# Patient Record
Sex: Male | Born: 1990 | Race: Black or African American | Hispanic: No | Marital: Single | Smoking: Never smoker
Health system: Southern US, Community
[De-identification: ages and names within clinical notes are randomized; demographics above are authoritative.]

## PROBLEM LIST (undated history)

## (undated) HISTORY — PX: ANKLE SURGERY: SHX546

---
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
## 2008-07-01 ENCOUNTER — Emergency Department (HOSPITAL_COMMUNITY): Admission: EM | Admit: 2008-07-01 | Discharge: 2008-07-01 | Payer: Self-pay | Admitting: Family Medicine

## 2009-11-21 ENCOUNTER — Emergency Department (HOSPITAL_COMMUNITY): Admission: EM | Admit: 2009-11-21 | Discharge: 2009-11-21 | Payer: Self-pay | Admitting: Emergency Medicine

## 2010-05-26 LAB — POCT I-STAT, CHEM 8
BUN: 11 mg/dL (ref 6–23)
Calcium, Ion: 1.16 mmol/L (ref 1.12–1.32)
Chloride: 104 mEq/L (ref 96–112)
Creatinine, Ser: 1.1 mg/dL (ref 0.4–1.5)
Glucose, Bld: 105 mg/dL — ABNORMAL HIGH (ref 70–99)
HCT: 48 % (ref 39.0–52.0)
Hemoglobin: 16.3 g/dL (ref 13.0–17.0)
Potassium: 3.8 mEq/L (ref 3.5–5.1)
Sodium: 141 mEq/L (ref 135–145)
TCO2: 26 mmol/L (ref 0–100)

## 2010-05-26 LAB — URINALYSIS, ROUTINE W REFLEX MICROSCOPIC
Glucose, UA: NEGATIVE mg/dL
Ketones, ur: 15 mg/dL — AB
Nitrite: NEGATIVE
Protein, ur: 100 mg/dL — AB
Specific Gravity, Urine: 1.028 (ref 1.005–1.030)
Urobilinogen, UA: 1 mg/dL (ref 0.0–1.0)
pH: 5.5 (ref 5.0–8.0)

## 2010-05-26 LAB — URINE CULTURE
Colony Count: NO GROWTH
Culture  Setup Time: 201109111102
Culture: NO GROWTH

## 2010-05-26 LAB — URINE MICROSCOPIC-ADD ON

## 2010-06-22 LAB — POCT URINALYSIS DIP (DEVICE)
Glucose, UA: 100 mg/dL — AB
Ketones, ur: >=160 mg/dL — AB
Nitrite: POSITIVE — AB
Protein, ur: NEGATIVE mg/dL
Specific Gravity, Urine: 1.025 (ref 1.005–1.030)
Urobilinogen, UA: 2 mg/dL — ABNORMAL HIGH (ref 0.0–1.0)
pH: 6 (ref 5.0–8.0)

## 2010-06-22 LAB — POCT I-STAT, CHEM 8
BUN: 7 mg/dL (ref 6–23)
Chloride: 96 mEq/L (ref 96–112)
HCT: 51 % (ref 39.0–52.0)
Potassium: 3.4 mEq/L — ABNORMAL LOW (ref 3.5–5.1)

## 2010-06-22 LAB — URINE CULTURE: Culture: NO GROWTH

## 2010-07-29 NOTE — H&P (Signed)
[place]  Patient:    Steve Valdez, Steve Valdez Visit Number: [id number] MRN: [id number]          Service Type: DSU Location: [facility] Attending Physician:  Twana First Dictated by:   Elana Alm Thurston Hole, M.D. Admit Date:  01/29/2001                           History and Physical  PREOPERATIVE DIAGNOSIS:  Left ankle Salter 3 fracture.  POSTOPERATIVE DIAGNOSIS:  Left ankle Salter 3 fracture.  PROCEDURE: 1. Left ankle arthroscopy with debridement. 2. ORIF left ankle Salter 3 fracture.  SURGEON:  Elana Alm. Thurston Hole, M.D.  ASSISTANT:  Julien Girt, P.A.  ANESTHESIA:  General.  OPERATIVE TIME:  One hour.  COMPLICATIONS:  None.  INDICATIONS FOR PROCEDURE:  Steve Valdez is a 20 year old who sustained a displaced Salter 3 fracture of his left ankle approximately 10 days ago.  He is now to undergo ORIF and arthroscopy of this.  DESCRIPTION:  Steve Valdez was brought to the operating room on January 29, 2001, placed on the operative table in supine position.  After an adequate level of general anesthesia was obtained, his left ankle was sterilely injected with 0.25% Marcaine with epinephrine in the anteromedial and anterolateral portal sites.  The left foot and leg were prepped using sterile Betadine and draped using sterile technique.  He received Ancef 1 g IV preoperatively for prophylaxis.  Initially the arthroscopy was performed through an anterolateral and anteromedial portal, carefully incising only the skin and carefully dissecting down to the joint to prevent any injury to the dorsal cutaneous nerves.  The arthroscope with a pump attached was placed and through an anteromedial portal an arthroscopic debrider was placed.  Significant hemorrhage in the joint was thoroughly debrided.  The fracture was well visualized going from anterior to posterior.  After this debridement was carried out no further damage was noted in the articular cartilage - only  the fracture line through it.  At this point then, under fluoroscopic control, two K-wires from the cannulated 4.0 mm screw set were placed from the medial malleolus across parallel to the joint, staying in the epiphysis across the fracture site.  These were then measured and overdrilled with a 2.7 mm drill and then the appropriate length 4.0 mm screws were placed, thus reducing the fracture and holding it into a reduced and anatomic position.  After this was done, this arthroscope was placed back in the joint.  The fracture was found to be reduced satisfactorily.  AP and lateral fluoroscopic x-rays also confirmed satisfactory position of the fracture and of the hardware, and the hardware was in the epiphysis and did not cross into the physis or go into the joint.  At this point the wounds were closed with interrupted 4-0 nylon sutures.  Sterile dressings and a short leg splint were applied, and then the patient awakened and taken to the recovery room in stable condition.  FOLLOW-UP CARE:  Steve Valdez will be followed as an outpatient on Vicodin for pain. See him back in the office in a week for sutures out and follow-up.  Also of note, I have discussed in great detail with his parents the risk for growth arrest and growth abnormalities due to the severity of his fracture, despite the fact that the fracture is reduced in a satisfactory and anatomic position, that there is still significant chance for growth abnormalities due to the fracture going through the growth  plate, and they understand this.Dictated by:   Elana Alm Thurston Hole, M.D. Attending Physician:  Twana First DD:  01/29/01 TD:  01/29/01 Job: [id number]

## 2011-03-07 ENCOUNTER — Emergency Department (HOSPITAL_COMMUNITY)
Admission: EM | Admit: 2011-03-07 | Discharge: 2011-03-08 | Disposition: A | Discharge status: Home / Self Care | Payer: Managed Care, Other (non HMO) | Attending: Emergency Medicine | Admitting: Emergency Medicine

## 2011-03-07 ENCOUNTER — Encounter: Payer: Self-pay | Admitting: Emergency Medicine

## 2011-03-07 DIAGNOSIS — R10819 Abdominal tenderness, unspecified site: Secondary | ICD-10-CM | POA: Insufficient documentation

## 2011-03-07 DIAGNOSIS — R748 Abnormal levels of other serum enzymes: Secondary | ICD-10-CM | POA: Insufficient documentation

## 2011-03-07 DIAGNOSIS — R112 Nausea with vomiting, unspecified: Secondary | ICD-10-CM

## 2011-03-07 DIAGNOSIS — E669 Obesity, unspecified: Secondary | ICD-10-CM | POA: Insufficient documentation

## 2011-03-07 DIAGNOSIS — E876 Hypokalemia: Secondary | ICD-10-CM | POA: Insufficient documentation

## 2011-03-07 DIAGNOSIS — E860 Dehydration: Secondary | ICD-10-CM | POA: Insufficient documentation

## 2011-03-07 DIAGNOSIS — R197 Diarrhea, unspecified: Secondary | ICD-10-CM | POA: Insufficient documentation

## 2011-03-07 LAB — DIFFERENTIAL
Basophils Relative: 2 % — ABNORMAL HIGH (ref 0–1)
Eosinophils Relative: 5 % (ref 0–5)
Lymphocytes Relative: 25 % (ref 12–46)
Lymphs Abs: 1.9 10*3/uL (ref 0.7–4.0)
Monocytes Relative: 17 % — ABNORMAL HIGH (ref 3–12)
Neutro Abs: 3.7 10*3/uL (ref 1.7–7.7)

## 2011-03-07 LAB — CBC
HCT: 40.8 % (ref 39.0–52.0)
Hemoglobin: 14.2 g/dL (ref 13.0–17.0)
MCV: 83.8 fL (ref 78.0–100.0)
WBC: 7.5 10*3/uL (ref 4.0–10.5)

## 2011-03-07 LAB — COMPREHENSIVE METABOLIC PANEL
Albumin: 3.9 g/dL (ref 3.5–5.2)
Alkaline Phosphatase: 54 U/L (ref 39–117)
BUN: 7 mg/dL (ref 6–23)
CO2: 26 mEq/L (ref 19–32)
Chloride: 103 mEq/L (ref 96–112)
GFR calc non Af Amer: >90 mL/min (ref >90)
Glucose, Bld: 92 mg/dL (ref 70–99)
Potassium: 3.1 mEq/L — ABNORMAL LOW (ref 3.5–5.1)
Total Bilirubin: 1 mg/dL (ref 0.3–1.2)

## 2011-03-07 LAB — URINALYSIS, MICROSCOPIC ONLY
Nitrite: NEGATIVE
Protein, ur: 30 mg/dL — AB
pH: 6 (ref 5.0–8.0)

## 2011-03-07 LAB — LIPASE, BLOOD: Lipase: 21 U/L (ref 11–59)

## 2011-03-07 MED ORDER — ONDANSETRON HCL 4 MG/2ML IJ SOLN
4.0000 mg | Freq: Once | INTRAMUSCULAR | Status: AC
  Administered 2011-03-07: 4 mg via INTRAVENOUS
  Filled 2011-03-07: qty 2

## 2011-03-07 MED ORDER — SODIUM CHLORIDE 0.9 % IV BOLUS (SEPSIS)
1000.0000 mL | Freq: Once | INTRAVENOUS | Status: AC
  Administered 2011-03-07: 1000 mL via INTRAVENOUS

## 2011-03-07 NOTE — ED Notes (Signed)
Pt presented to the ER with c/o diarrhea, states that s/s started about 1 week ago, last Wednesday, pt states "signs of stomach flu", also states that prior these s/s pt was Dx with cold, denies at this time chills, fever, however states feeling of weakness and tiredness. Pt furthers explains that as soon as he takes something PO, fluid or food, has to use bathroom. Pt took at home peptobismol, imodium, no relief of symptoms.

## 2011-03-08 MED ORDER — SIMETHICONE 80 MG PO CHEW: 80.0000 mg | CHEWABLE_TABLET | Freq: Four times a day (QID) | ORAL | Status: AC | PRN

## 2011-03-08 MED ORDER — HYOSCYAMINE SULFATE 0.125 MG PO TABS
0.2500 mg | ORAL_TABLET | Freq: Once | ORAL | Status: AC
  Administered 2011-03-08: 0.25 mg via ORAL
  Filled 2011-03-08: qty 2

## 2011-03-08 MED ORDER — POTASSIUM CHLORIDE CRYS ER 20 MEQ PO TBCR
40.0000 meq | EXTENDED_RELEASE_TABLET | Freq: Once | ORAL | Status: AC
  Administered 2011-03-08: 40 meq via ORAL
  Filled 2011-03-08: qty 2

## 2011-03-08 MED ORDER — DIPHENOXYLATE-ATROPINE 2.5-0.025 MG PO TABS: 1.0000 | ORAL_TABLET | Freq: Four times a day (QID) | ORAL | Status: AC | PRN

## 2011-03-08 MED ORDER — ONDANSETRON 8 MG PO TBDP: 8.0000 mg | ORAL_TABLET | Freq: Three times a day (TID) | ORAL | Status: AC | PRN

## 2011-03-08 NOTE — ED Provider Notes (Signed)
History     CSN: [id number]  Arrival date & time 03/07/11  2044   First MD Initiated Contact with Patient 03/07/11 2142      Chief Complaint  Patient presents with  . Diarrhea  . Nausea  . Emesis    (Consider location/radiation/quality/duration/timing/severity/associated sxs/prior treatment) Patient is a 20 y.o. male presenting with diarrhea. The history is provided by the patient.  Diarrhea The primary symptoms include nausea, vomiting and diarrhea. Primary symptoms do not include abdominal pain or dysuria. The illness began 6 to 7 days ago. The onset was gradual. The problem has not changed since onset. Vomiting occurred once. The emesis contains stomach contents.  The diarrhea began 6 to 7 days ago. The diarrhea is mucous and semi-solid. The diarrhea occurs 2 to 4 times per day.  The illness is also significant for bloating. The illness does not include back pain.  Pt reports abdominal "bloating and gassyness." Denies pain. States at times gets episodes of nausea, and vomiting. Able to tolerate po intake most of the time, but states once he eats something, he feels like he has to use the bathroom. Denies fever, chills, malaise. States had a cold prior to starting of symptoms. Took peptobismol and immodium with no relief.   History reviewed. No pertinent past medical history.  Past Surgical History  Procedure Date  . Ankle surgery     History reviewed. No pertinent family history.  History  Substance Use Topics  . Smoking status: Never Smoker   . Smokeless tobacco: Not on file  . Alcohol Use: No      Review of Systems  Constitutional: Negative.   HENT: Negative.   Eyes: Negative.   Respiratory: Negative.   Cardiovascular: Negative.   Gastrointestinal: Positive for nausea, vomiting, diarrhea, abdominal distention and bloating. Negative for abdominal pain and blood in stool.  Genitourinary: Negative for dysuria, urgency, frequency and flank pain.  Musculoskeletal:  Negative for back pain.  Skin: Negative.   Neurological: Negative.   Hematological: Negative.     Allergies  Review of patient's allergies indicates no known allergies.  Home Medications   Current Outpatient Rx  Name Route Sig Dispense Refill  . IBUPROFEN 200 MG PO TABS Oral Take 200 mg by mouth every 6 (six) hours as needed. For generic pain      . PSYLLIUM 95 % PO PACK Oral Take 1 packet by mouth daily.      Marland Kitchen VITAMIN C 250 MG PO TABS Oral Take 250 mg by mouth daily.        BP 113/53  Pulse 78  Temp(Src) 98.6 F (37 C) (Oral)  Resp 20  SpO2 100%  Physical Exam  Nursing note and vitals reviewed. Constitutional: He is oriented to person, place, and time. He appears well-developed and well-nourished. No distress.       obese  Eyes: Conjunctivae are normal.  Cardiovascular: Normal rate, regular rhythm and normal heart sounds.   Pulmonary/Chest: Effort normal and breath sounds normal. No respiratory distress.  Abdominal: Soft. Bowel sounds are normal.       Obese, diffuse tenderness, no guarding, no rebound tenderness  Musculoskeletal: Normal range of motion. He exhibits no edema.  Neurological: He is alert and oriented to person, place, and time.  Skin: Skin is warm and dry. No rash noted.  Psychiatric: He has a normal mood and affect.    ED Course  Procedures (including critical care time)  Labs Reviewed  DIFFERENTIAL - Abnormal; Notable for the following:  Monocytes Relative 17 (*)    Basophils Relative 2 (*)    Monocytes Absolute 1.3 (*)    Basophils Absolute 0.2 (*)    All other components within normal limits  COMPREHENSIVE METABOLIC PANEL - Abnormal; Notable for the following:    Potassium 3.1 (*)    AST 52 (*)    ALT 155 (*)    All other components within normal limits  URINALYSIS, MICROSCOPIC ONLY - Abnormal; Notable for the following:    Color, Urine AMBER (*) BIOCHEMICALS MAY BE AFFECTED BY COLOR   Specific Gravity, Urine 1.036 (*)    Bilirubin  Urine SMALL (*)    Ketones, ur TRACE (*)    Protein, ur 30 (*)    Bacteria, UA MANY (*)    All other components within normal limits  CBC  LIPASE, BLOOD  URINE CULTURE    Pt with diffuse abdominal tenderness, no guarding, no acute abdomen. No vomiting or episodes of diarrhea in ED. Pt in NAD. VS all within normal. Pt rehydrated with saline. Gassy feeling improved with levsin Pt tolerating POs. Will d/c home with precautions to return if fever, increased abdominal pain, unable to keep anything down.  UA showed many bacteria, neg leukocyte esterase, no WBCs. Urine cultures sent, gc/chlamydia sent. Pt has no urinary symptoms. No treatment started today.   MDM          Lottie Mussel, PA 03/08/11 0134

## 2011-03-09 LAB — URINE CULTURE
Colony Count: NO GROWTH
Culture  Setup Time: 201212260405

## 2011-03-09 NOTE — ED Provider Notes (Signed)
Medical screening examination/treatment/procedure(s) were performed by non-physician practitioner and as supervising physician I was immediately available for consultation/collaboration.   Dione Booze, MD 03/09/11 [phone]

## 2019-04-03 ENCOUNTER — Other Ambulatory Visit: Payer: Self-pay

## 2019-04-03 DIAGNOSIS — Z20822 Contact with and (suspected) exposure to covid-19: Secondary | ICD-10-CM

## 2019-04-05 LAB — NOVEL CORONAVIRUS, NAA

## 2019-05-05 ENCOUNTER — Other Ambulatory Visit: Payer: Self-pay

## 2019-05-05 DIAGNOSIS — Z20822 Contact with and (suspected) exposure to covid-19: Secondary | ICD-10-CM

## 2019-05-06 LAB — NOVEL CORONAVIRUS, NAA: SARS-CoV-2, NAA: NOT DETECTED

## 2019-09-04 ENCOUNTER — Other Ambulatory Visit: Payer: Self-pay

## 2019-09-04 ENCOUNTER — Emergency Department (HOSPITAL_COMMUNITY)
Admission: EM | Admit: 2019-09-04 | Discharge: 2019-09-04 | Disposition: A | Discharge status: Home / Self Care | Payer: Self-pay | Attending: Emergency Medicine | Admitting: Emergency Medicine

## 2019-09-04 ENCOUNTER — Emergency Department (HOSPITAL_COMMUNITY): Payer: Self-pay

## 2019-09-04 ENCOUNTER — Encounter (HOSPITAL_COMMUNITY): Payer: Self-pay

## 2019-09-04 DIAGNOSIS — R079 Chest pain, unspecified: Secondary | ICD-10-CM | POA: Insufficient documentation

## 2019-09-04 DIAGNOSIS — R112 Nausea with vomiting, unspecified: Secondary | ICD-10-CM | POA: Insufficient documentation

## 2019-09-04 LAB — CBC
HCT: 47.7 % (ref 39.0–52.0)
Hemoglobin: 15.3 g/dL (ref 13.0–17.0)
MCH: 29.3 pg (ref 26.0–34.0)
MCHC: 32.1 g/dL (ref 30.0–36.0)
MCV: 91.4 fL (ref 80.0–100.0)
Platelets: 241 10*3/uL (ref 150–400)
RBC: 5.22 MIL/uL (ref 4.22–5.81)
RDW: 12.1 % (ref 11.5–15.5)
WBC: 9.7 10*3/uL (ref 4.0–10.5)
nRBC: 0 % (ref 0.0–0.2)

## 2019-09-04 LAB — BASIC METABOLIC PANEL
Anion gap: 11 (ref 5–15)
BUN: 10 mg/dL (ref 6–20)
CO2: 27 mmol/L (ref 22–32)
Calcium: 9.2 mg/dL (ref 8.9–10.3)
Chloride: 102 mmol/L (ref 98–111)
Creatinine, Ser: 1.1 mg/dL (ref 0.61–1.24)
GFR calc Af Amer: >60 mL/min (ref >60)
GFR calc non Af Amer: >60 mL/min (ref >60)
Glucose, Bld: 151 mg/dL — ABNORMAL HIGH (ref 70–99)
Potassium: 3.7 mmol/L (ref 3.5–5.1)
Sodium: 140 mmol/L (ref 135–145)

## 2019-09-04 LAB — TROPONIN I (HIGH SENSITIVITY): Troponin I (High Sensitivity): 2 ng/L (ref <18)

## 2019-09-04 NOTE — ED Triage Notes (Signed)
Pt sts sudden left sided chest tightness that radiates down right arm that began at approx 1700 tonight while at work. Pt then pulled over on side of road and had 3 episodes of vomiting.

## 2019-09-08 NOTE — ED Provider Notes (Signed)
[facility] Note   CSN: [id number] Arrival date & time: 09/04/19  1948     History Chief Complaint  Patient presents with  . Chest Pain    Steve Valdez is a 29 y.o. male.  HPI   [name] with numerous complaints. Was driving in his car when he began to feel and and as if he may pass out. Subsequently vomited. Burning discomfort in his chest since. Worse when he takes a deep breath and with certain movements. Was feeling lightheaded earlier in the day as if he may pass out but no CP until after he vomited. Obese. Otherwise denies significant medical history.  History reviewed. No pertinent past medical history.  There are no problems to display for this patient.   Past Surgical History:  Procedure Laterality Date  . ANKLE SURGERY         No family history on file.  Social History   Tobacco Use  . Smoking status: Never Smoker  . Smokeless tobacco: Never Used  Substance Use Topics  . Alcohol use: No  . Drug use: No    Home Medications Prior to Admission medications   Medication Sig Start Date End Date Taking? Authorizing Provider  ibuprofen (ADVIL,MOTRIN) 200 MG tablet Take 200 mg by mouth every 6 (six) hours as needed. For generic pain     Yes [provider]    Allergies    Patient has no known allergies.  Review of Systems   Review of Systems All systems reviewed and negative, other than as noted in HPI.  Physical Exam Updated Vital Signs BP 128/71 (BP Location: Right Arm)   Pulse 72   Temp 98.7 F (37.1 C)   Resp 16   Ht 5\' 6"  (1.676 m)   Wt 136.1 kg   SpO2 96%   BMI 48.42 kg/m   Physical Exam Vitals and nursing note reviewed.  Constitutional:      General: He is not in acute distress.    Appearance: He is well-developed. He is obese.  HENT:     Head: Normocephalic and atraumatic.  Eyes:     General:        Right eye: No discharge.        Left eye: No discharge.      Conjunctiva/sclera: Conjunctivae normal.  Cardiovascular:     Rate and Rhythm: Normal rate and regular rhythm.     Heart sounds: Normal heart sounds. No murmur heard.  No friction rub. No gallop.   Pulmonary:     Effort: Pulmonary effort is normal. No respiratory distress.     Breath sounds: Normal breath sounds.  Abdominal:     General: There is no distension.     Palpations: Abdomen is soft.     Tenderness: There is no abdominal tenderness.  Musculoskeletal:        General: No tenderness.     Cervical back: Neck supple.  Skin:    General: Skin is warm and dry.  Neurological:     Mental Status: He is alert.  Psychiatric:        Behavior: Behavior normal.        Thought Content: Thought content normal.     ED Results / Procedures / Treatments   Labs (all labs ordered are listed, but only abnormal results are displayed) Labs Reviewed  BASIC METABOLIC PANEL - Abnormal; Notable for the following components:      Result Value   Glucose, Bld 151 (*)  All other components within normal limits  CBC  TROPONIN I (HIGH SENSITIVITY)    EKG EKG Interpretation  Date/Time:  Thursday September 04 2019 20:02:22 EDT Ventricular Rate:  91 PR Interval:    QRS Duration: 95 QT Interval:  356 QTC Calculation: 438 R Axis:   72 Text Interpretation: Sinus rhythm 12 Lead; Mason-Likar Confirmed by Raeford Razor [phone]) on 09/04/2019 10:10:58 PM   Radiology No results found.   DG Chest 2 View  Result Date: 09/04/2019 CLINICAL DATA:  Left-sided chest pain. EXAM: CHEST - 2 VIEW COMPARISON:  None. FINDINGS: The heart size and mediastinal contours are within normal limits. Both lungs are clear. The visualized skeletal structures are unremarkable. IMPRESSION: No active cardiopulmonary disease. Electronically Signed   By: Aram Candela M.D.   On: 09/04/2019 20:23    Procedures Procedures (including critical care time)  Medications Ordered in ED Medications - No data to display  ED  Course  I have reviewed the triage vital signs and the nursing notes.  Pertinent labs & imaging results that were available during my care of the patient were reviewed by me and considered in my medical decision making (see chart for details).    MDM Rules/Calculators/A&P                          [name] with CP. Atypical for ACS. Could be from esophageal irritation. Burning quality and onset after vomiting. Have impression that other symptoms earlier today near syncope. Those have improved. HD stable in ED. Exam reassuring.   Final Clinical Impression(s) / ED Diagnoses Final diagnoses:  Chest pain, unspecified type  Nausea and vomiting, intractability of vomiting not specified, unspecified vomiting type    Rx / DC Orders ED Discharge Orders    None       Raeford Razor, MD 09/08/19 [phone]

## 2019-12-12 ENCOUNTER — Other Ambulatory Visit: Payer: Self-pay

## 2021-04-30 IMAGING — CR DG CHEST 2V
2 series · 2 of 2 positions shown · non-contrast
Comparison: None.

CLINICAL DATA: Left-sided chest pain.

EXAM:
CHEST - 2 VIEW

[w chest pa]
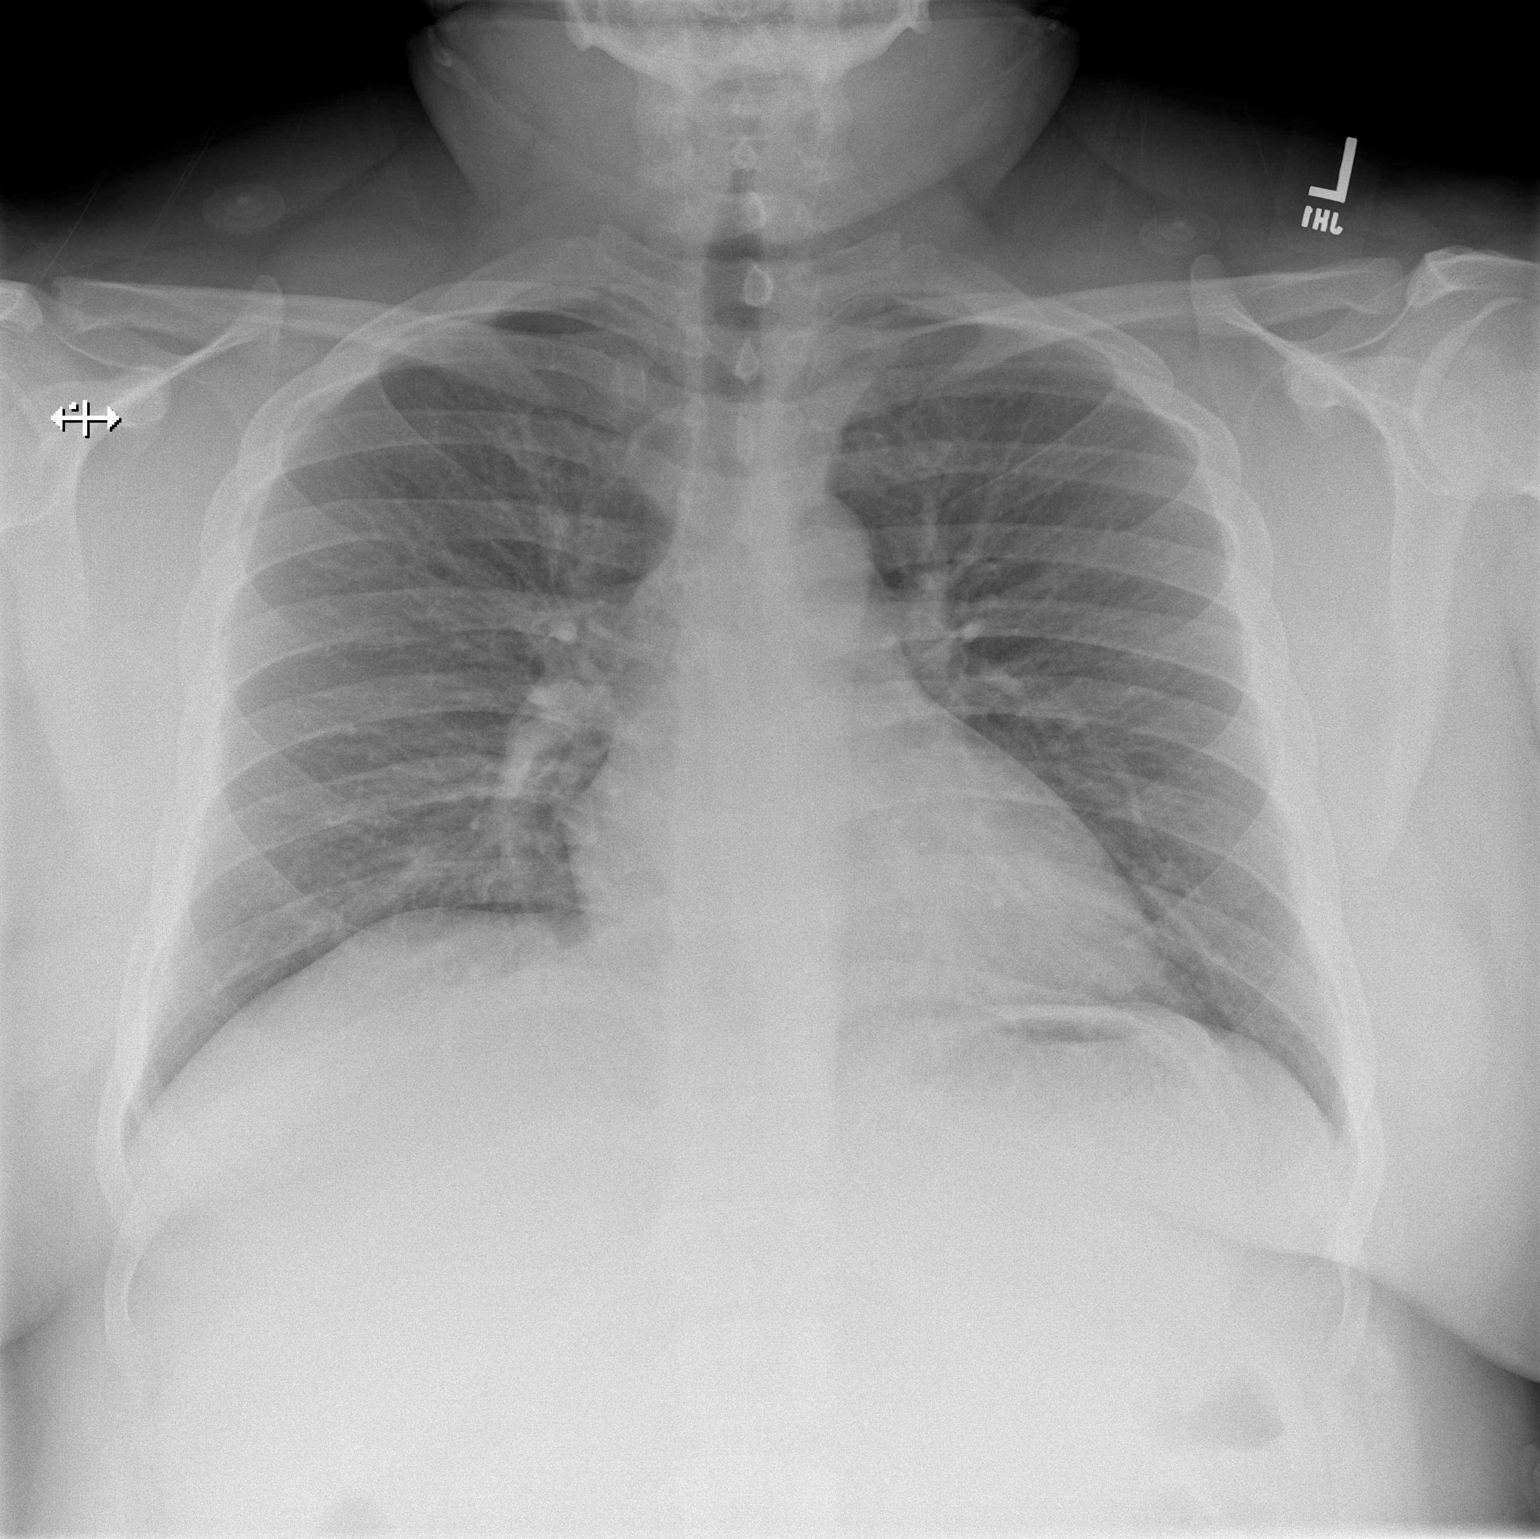

[w chest lat]
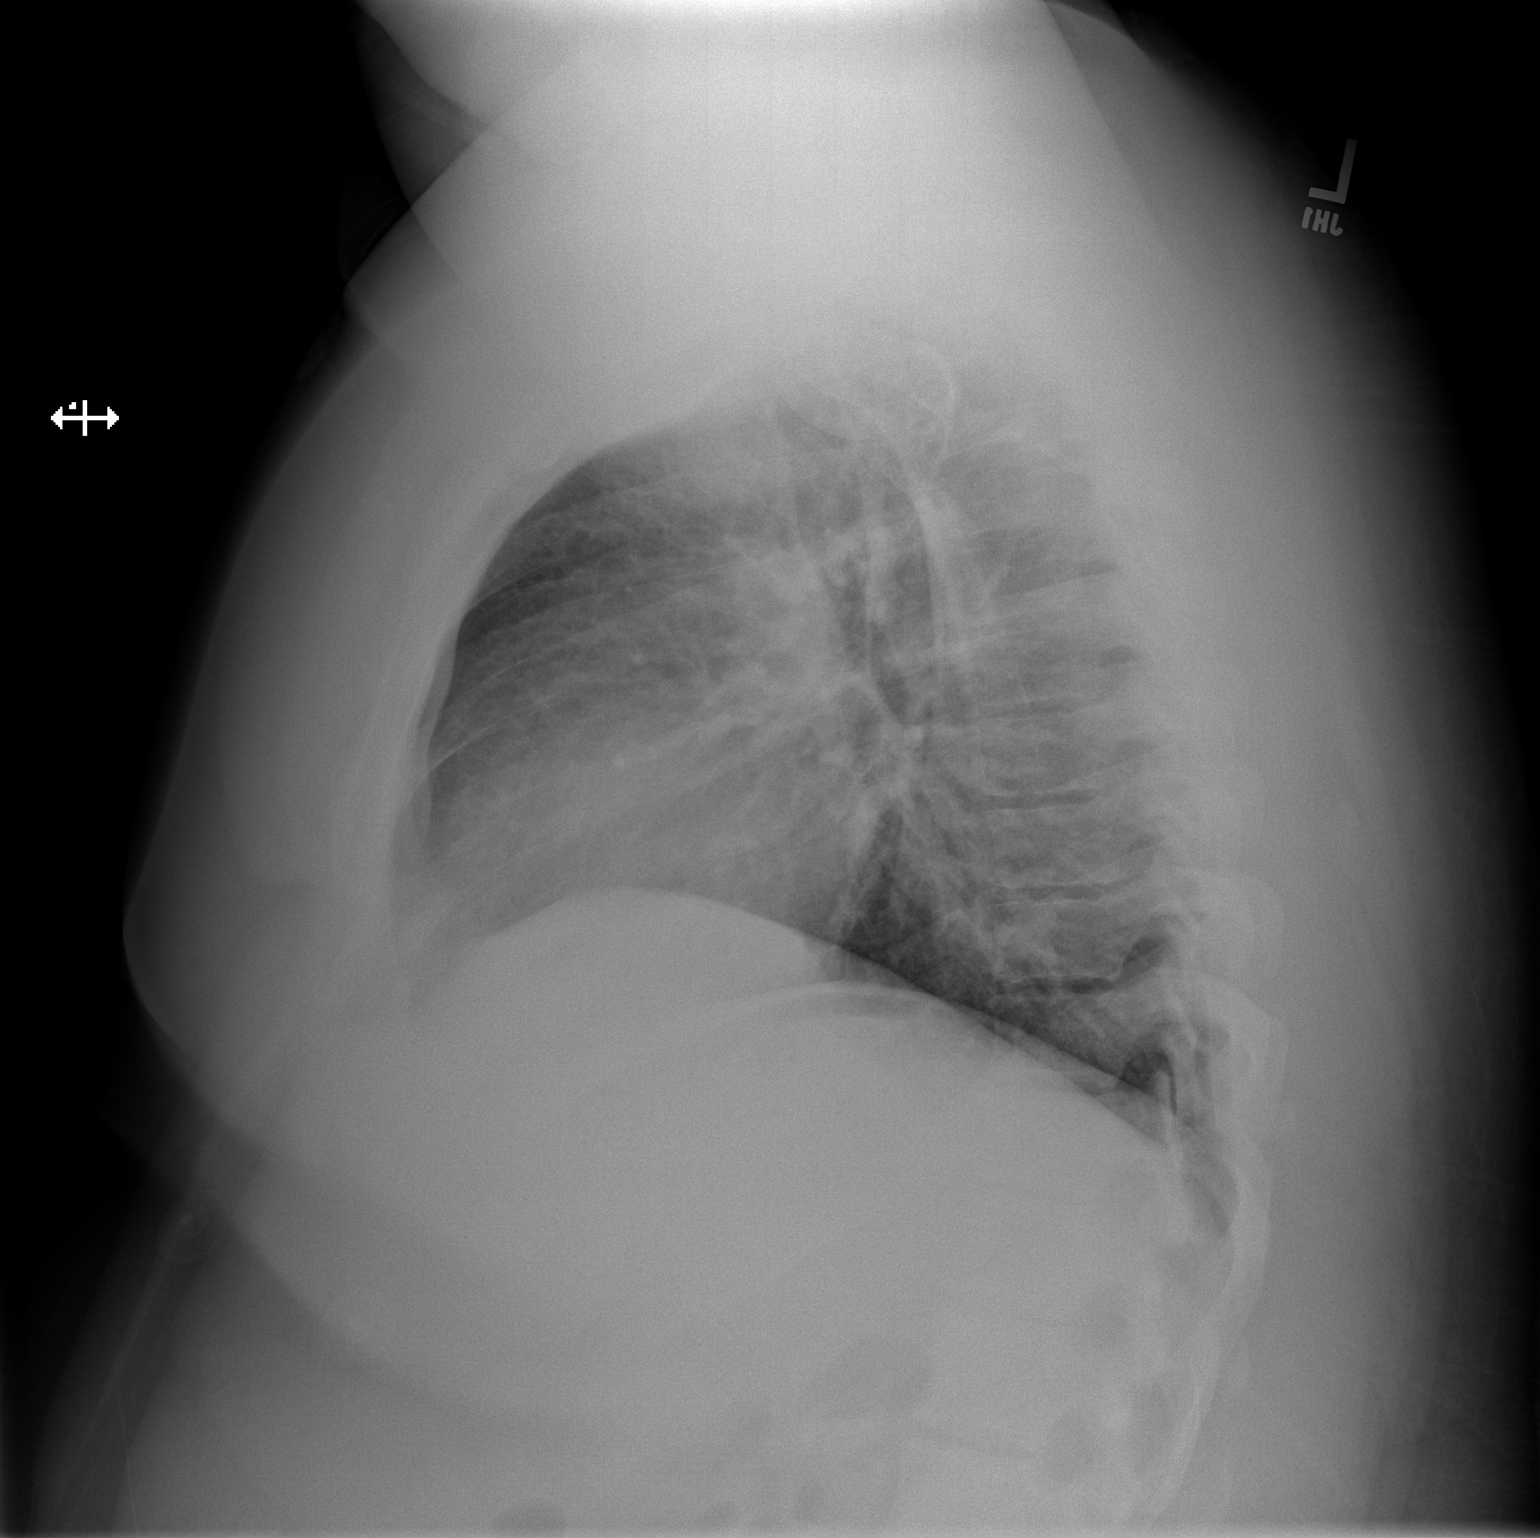

[2 of 2 positions shown; findings below may reference images not displayed]

FINDINGS: The heart size and mediastinal contours are within normal limits.
Both lungs are clear. The visualized skeletal structures are
unremarkable.
IMPRESSION: No active cardiopulmonary disease.
